# Patient Record
Sex: Female | Born: 1960
Health system: Southern US, Community
[De-identification: ages and names within clinical notes are randomized; demographics above are authoritative.]

## PROBLEM LIST (undated history)

## (undated) DIAGNOSIS — Z789 Other specified health status: Secondary | ICD-10-CM

## (undated) HISTORY — PX: REFRACTIVE SURGERY: SHX103

## (undated) HISTORY — PX: WISDOM TOOTH EXTRACTION: SHX21

## (undated) HISTORY — PX: LASIK: SHX215

## (undated) HISTORY — PX: COSMETIC SURGERY: SHX468

## (undated) HISTORY — PX: CYST EXCISION: SHX5701

---

## 2012-10-19 ENCOUNTER — Other Ambulatory Visit: Payer: Self-pay | Admitting: Orthopedic Surgery

## 2012-10-20 NOTE — H&P (Signed)
Zelene Barga is an 52 y.o. female.   CC / Reason for Visit: Right thumb pain HPI: This patient returns for reevaluation.  She reports that the injection caused decreased pain and locking, but she still reports considerable pain with hyperextension forces.  She does not think that her avid cycling bothers this.   Presenting history follows: This patient is a 52 year old female who presents for evaluation of her right thumb.  She reports that it will lock in flexion and that there is a lot of pain if it is forced into extension or hyperextension.  She has switched to using her left hand for her computer mouse.  No past medical history on file.  No past surgical history on file.  No family history on file. Social History:  has no tobacco, alcohol, and drug history on file.  Allergies: Allergies not on file  No prescriptions prior to admission    No results found for this or any previous visit (from the past 48 hour(s)). No results found.  Review of Systems  All other systems reviewed and are negative.    There were no vitals taken for this visit. Physical Exam  Constitutional:  WD, WN, NAD HEENT:  NCAT, EOMI Neuro/Psych:  Alert & oriented to person, place, and time; appropriate mood & affect Lymphatic: No generalized UE edema or lymphadenopathy Extremities / MSK:  Both UE are normal with respect to appearance, ranges of motion, joint stability, muscle strength/tone, sensation, & perfusion except as otherwise noted:  Right thumb tender over the A1 pulley, locking both with flexion and extension.  Labs / Xrays:  No radiographic studies obtained today.  Assessment: Right trigger thumb  Plan:  I discussed these findings with her.  I discussed the recommendation for surgical release if this injection fails to cause it to resolve.   The details of the operative procedure were discussed with the patient.  Questions were invited and answered.  In addition to the goal of the  procedure, the risks of the procedure to include but not limited to bleeding; infection; damage to the nerves or blood vessels that could result in bleeding, numbness, weakness, chronic pain, and the need for additional procedures; stiffness; the need for revision surgery; and anesthetic risks, the worst of which is death, were reviewed.  No specific outcome was guaranteed or implied.  Informed consent was obtained.     Adysen Raphael A. 10/20/2012, 3:30 PM

## 2012-10-21 ENCOUNTER — Encounter (HOSPITAL_BASED_OUTPATIENT_CLINIC_OR_DEPARTMENT_OTHER): Payer: Self-pay | Admitting: *Deleted

## 2012-10-21 NOTE — Progress Notes (Signed)
Just moved from [place]-cycles-no health issues

## 2012-10-24 ENCOUNTER — Ambulatory Visit (HOSPITAL_BASED_OUTPATIENT_CLINIC_OR_DEPARTMENT_OTHER)
Admission: RE | Admit: 2012-10-24 | Discharge: 2012-10-24 | Disposition: A | Discharge status: Home / Self Care | Payer: Federal, State, Local not specified - PPO | Source: Ambulatory Visit | Attending: Orthopedic Surgery | Admitting: Orthopedic Surgery

## 2012-10-24 ENCOUNTER — Encounter (HOSPITAL_BASED_OUTPATIENT_CLINIC_OR_DEPARTMENT_OTHER): Payer: Self-pay | Admitting: *Deleted

## 2012-10-24 ENCOUNTER — Encounter (HOSPITAL_BASED_OUTPATIENT_CLINIC_OR_DEPARTMENT_OTHER)
Admission: RE | Disposition: A | Discharge status: Home / Self Care | Payer: Self-pay | Source: Ambulatory Visit | Attending: Orthopedic Surgery

## 2012-10-24 ENCOUNTER — Encounter (HOSPITAL_BASED_OUTPATIENT_CLINIC_OR_DEPARTMENT_OTHER): Payer: Self-pay | Admitting: Anesthesiology

## 2012-10-24 ENCOUNTER — Ambulatory Visit (HOSPITAL_BASED_OUTPATIENT_CLINIC_OR_DEPARTMENT_OTHER): Payer: Federal, State, Local not specified - PPO | Admitting: Anesthesiology

## 2012-10-24 DIAGNOSIS — M6530 Trigger finger, unspecified finger: Secondary | ICD-10-CM | POA: Insufficient documentation

## 2012-10-24 HISTORY — DX: Other specified health status: Z78.9

## 2012-10-24 HISTORY — PX: TRIGGER FINGER RELEASE: SHX641

## 2012-10-24 SURGERY — RELEASE, A1 PULLEY, FOR TRIGGER FINGER
Anesthesia: Monitor Anesthesia Care | Site: Thumb | Laterality: Right | Wound class: Clean

## 2012-10-24 MED ORDER — LACTATED RINGERS IV SOLN
INTRAVENOUS | Status: DC
  Administered 2012-10-24: 10:00:00 via INTRAVENOUS

## 2012-10-24 MED ORDER — OXYCODONE HCL 5 MG/5ML PO SOLN: 5.0000 mg | Freq: Once | ORAL | Status: DC | PRN

## 2012-10-24 MED ORDER — OXYCODONE HCL 5 MG PO TABS: 5.0000 mg | ORAL_TABLET | Freq: Once | ORAL | Status: DC | PRN

## 2012-10-24 MED ORDER — MIDAZOLAM HCL 5 MG/5ML IJ SOLN
INTRAMUSCULAR | Status: DC | PRN
  Administered 2012-10-24: 2 mg via INTRAVENOUS

## 2012-10-24 MED ORDER — HYDROMORPHONE HCL PF 1 MG/ML IJ SOLN: 0.2500 mg | INTRAMUSCULAR | Status: DC | PRN

## 2012-10-24 MED ORDER — PROPOFOL INFUSION 10 MG/ML OPTIME
INTRAVENOUS | Status: DC | PRN
  Administered 2012-10-24: 100 ug/kg/min via INTRAVENOUS

## 2012-10-24 MED ORDER — CEFAZOLIN SODIUM-DEXTROSE 2-3 GM-% IV SOLR
2.0000 g | INTRAVENOUS | Status: AC
  Administered 2012-10-24: 2 g via INTRAVENOUS

## 2012-10-24 MED ORDER — ONDANSETRON HCL 4 MG/2ML IJ SOLN: 4.0000 mg | Freq: Once | INTRAMUSCULAR | Status: DC | PRN

## 2012-10-24 MED ORDER — FENTANYL CITRATE 0.05 MG/ML IJ SOLN
INTRAMUSCULAR | Status: DC | PRN
  Administered 2012-10-24: 50 ug via INTRAVENOUS

## 2012-10-24 MED ORDER — LIDOCAINE-EPINEPHRINE (PF) 1 %-1:200000 IJ SOLN
INTRAMUSCULAR | Status: DC | PRN
  Administered 2012-10-24: 1.5 mL

## 2012-10-24 MED ORDER — ONDANSETRON HCL 4 MG/2ML IJ SOLN
INTRAMUSCULAR | Status: DC | PRN
  Administered 2012-10-24: 4 mg via INTRAVENOUS

## 2012-10-24 MED ORDER — HYDROCODONE-ACETAMINOPHEN 5-325 MG PO TABS: 1.0000 | ORAL_TABLET | ORAL | Status: DC | PRN

## 2012-10-24 MED ORDER — BUPIVACAINE HCL 0.25 % IJ SOLN
INTRAMUSCULAR | Status: DC | PRN
  Administered 2012-10-24: 1.5 mL

## 2012-10-24 MED ORDER — LACTATED RINGERS IV SOLN
INTRAVENOUS | Status: DC
  Administered 2012-10-24: 09:00:00 via INTRAVENOUS

## 2012-10-24 SURGICAL SUPPLY — 38 items
BANDAGE COBAN STERILE 2 (GAUZE/BANDAGES/DRESSINGS) IMPLANT
BANDAGE CONFORM 2  STR LF (GAUZE/BANDAGES/DRESSINGS) ×2 IMPLANT
BANDAGE GAUZE ELAST BULKY 4 IN (GAUZE/BANDAGES/DRESSINGS) IMPLANT
BLADE MINI RND TIP GREEN BEAV (BLADE) IMPLANT
BLADE SURG 15 STRL LF DISP TIS (BLADE) ×1 IMPLANT
BLADE SURG 15 STRL SS (BLADE) ×1
BNDG COHESIVE 1X5 TAN STRL LF (GAUZE/BANDAGES/DRESSINGS) ×2 IMPLANT
BNDG COHESIVE 4X5 TAN STRL (GAUZE/BANDAGES/DRESSINGS) IMPLANT
BNDG ESMARK 4X9 LF (GAUZE/BANDAGES/DRESSINGS) ×2 IMPLANT
CHLORAPREP W/TINT 26ML (MISCELLANEOUS) ×2 IMPLANT
COVER MAYO STAND STRL (DRAPES) ×2 IMPLANT
COVER TABLE BACK 60X90 (DRAPES) ×2 IMPLANT
CUFF TOURNIQUET SINGLE 18IN (TOURNIQUET CUFF) ×2 IMPLANT
DRAPE EXTREMITY T 121X128X90 (DRAPE) ×2 IMPLANT
DRAPE SURG 17X23 STRL (DRAPES) ×2 IMPLANT
DRSG EMULSION OIL 3X3 NADH (GAUZE/BANDAGES/DRESSINGS) ×2 IMPLANT
GAUZE SPONGE 4X4 12PLY STRL LF (GAUZE/BANDAGES/DRESSINGS) ×2 IMPLANT
GLOVE BIO SURGEON STRL SZ 6.5 (GLOVE) ×2 IMPLANT
GLOVE BIO SURGEON STRL SZ7.5 (GLOVE) ×2 IMPLANT
GLOVE BIOGEL PI IND STRL 7.0 (GLOVE) ×2 IMPLANT
GLOVE BIOGEL PI IND STRL 8 (GLOVE) ×1 IMPLANT
GLOVE BIOGEL PI INDICATOR 7.0 (GLOVE) ×2
GLOVE BIOGEL PI INDICATOR 8 (GLOVE) ×1
GOWN PREVENTION PLUS XLARGE (GOWN DISPOSABLE) ×4 IMPLANT
NDL SAFETY ECLIPSE 18X1.5 (NEEDLE) ×1 IMPLANT
NEEDLE HYPO 18GX1.5 SHARP (NEEDLE) ×1
NEEDLE HYPO 25X1 1.5 SAFETY (NEEDLE) ×2 IMPLANT
NS IRRIG 1000ML POUR BTL (IV SOLUTION) ×2 IMPLANT
PACK BASIN DAY SURGERY FS (CUSTOM PROCEDURE TRAY) ×2 IMPLANT
PADDING CAST ABS 4INX4YD NS (CAST SUPPLIES)
PADDING CAST ABS COTTON 4X4 ST (CAST SUPPLIES) IMPLANT
STOCKINETTE 4X48 STRL (DRAPES) ×2 IMPLANT
SUT VICRYL RAPIDE 4/0 PS 2 (SUTURE) ×2 IMPLANT
SYR BULB 3OZ (MISCELLANEOUS) ×2 IMPLANT
SYRINGE 10CC LL (SYRINGE) ×2 IMPLANT
TOWEL OR 17X24 6PK STRL BLUE (TOWEL DISPOSABLE) ×2 IMPLANT
TOWEL OR NON WOVEN STRL DISP B (DISPOSABLE) ×2 IMPLANT
UNDERPAD 30X30 INCONTINENT (UNDERPADS AND DIAPERS) ×2 IMPLANT

## 2012-10-24 NOTE — Transfer of Care (Signed)
Immediate Anesthesia Transfer of Care Note  Patient: Tina Garrett  Procedure(s) Performed: Procedure(s): RELEASE RIGHT TRIGGER THUMB (Right)  Patient Location: PACU  Anesthesia Type:MAC  Level of Consciousness: awake, alert  and oriented  Airway & Oxygen Therapy: Patient Spontanous Breathing  Post-op Assessment: Report given to PACU RN and Post -op Vital signs reviewed and stable  Post vital signs: Reviewed and stable  Complications: No apparent anesthesia complications

## 2012-10-24 NOTE — Anesthesia Preprocedure Evaluation (Signed)
Anesthesia Evaluation  Patient identified by MRN, date of birth, ID band Patient awake    Reviewed: Allergy & Precautions, H&P , NPO status , Patient's Chart, lab work & pertinent test results  Airway Mallampati: I TM Distance: >3 FB Neck ROM: Full    Dental  (+) Teeth Intact and Dental Advisory Given   Pulmonary  breath sounds clear to auscultation        Cardiovascular Rhythm:Regular Rate:Normal     Neuro/Psych    GI/Hepatic   Endo/Other    Renal/GU      Musculoskeletal   Abdominal   Peds  Hematology   Anesthesia Other Findings   Reproductive/Obstetrics                           Anesthesia Physical Anesthesia Plan  ASA: I  Anesthesia Plan: MAC   Post-op Pain Management:    Induction: Intravenous  Airway Management Planned: Simple Face Mask and Mask  Additional Equipment:   Intra-op Plan:   Post-operative Plan:   Informed Consent: I have reviewed the patients History and Physical, chart, labs and discussed the procedure including the risks, benefits and alternatives for the proposed anesthesia with the patient or authorized representative who has indicated his/her understanding and acceptance.   Dental advisory given  Plan Discussed with: CRNA, Anesthesiologist and Surgeon  Anesthesia Plan Comments:         Anesthesia Quick Evaluation

## 2012-10-24 NOTE — Anesthesia Postprocedure Evaluation (Signed)
  Anesthesia Post-op Note  Patient: Tina Garrett  Procedure(s) Performed: Procedure(s): RELEASE RIGHT TRIGGER THUMB (Right)  Patient Location: PACU  Anesthesia Type:MAC  Level of Consciousness: awake, alert  and oriented  Airway and Oxygen Therapy: Patient Spontanous Breathing  Post-op Pain: mild  Post-op Assessment: Post-op Vital signs reviewed  Post-op Vital Signs: Reviewed  Complications: No apparent anesthesia complications

## 2012-10-24 NOTE — Op Note (Signed)
10/24/2012  10:01 AM  PATIENT:  Tina Garrett  52 y.o. female  PRE-OPERATIVE DIAGNOSIS:  Right trigger thumb  POST-OPERATIVE DIAGNOSIS:  Same  PROCEDURE:  Right trigger thumb release  SURGEON: Cliffton Asters. Janee Morn, MD  PHYSICIAN ASSISTANT: None  ANESTHESIA:  local and MAC  SPECIMENS:  None  DRAINS:   None  PREOPERATIVE INDICATIONS:  Tina Garrett is a  52 y.o. female with a diagnosis of right trigger thumb who failed conservative measures and elected for surgical management.    The risks benefits and alternatives were discussed with the patient preoperatively including but not limited to the risks of infection, bleeding, nerve injury, cardiopulmonary complications, the need for revision surgery, among others, and the patient verbalized understanding and consented to proceed.  OPERATIVE IMPLANTS: None  OPERATIVE FINDINGS: FPL had longitudinal split tear. This was debrided and internalized with 4-0 Vicryl Rapide suture  OPERATIVE PROCEDURE:  After receiving prophylactic antibiotics, the patient was escorted to the operative theatre and placed in a supine position.  A surgical "time-out" was performed during which the planned procedure, proposed operative site, and the correct patient identity were compared to the operative consent and agreement confirmed by the circulating nurse according to current facility policy. Local anesthetic was instilled, consisting of a mixture of lidocaine and Marcaine bearing epinephrine. Following application of a tourniquet to the operative extremity, the exposed skin was prepped with Chloraprep and draped in the usual sterile fashion.  The limb was exsanguinated with an Esmarch bandage and the tourniquet inflated to approximately higher than systolic BP.  A transverse incision was made at the base of the right thumb exploiting the skin crease. Skin and subcutaneous tissues were dissected with spreading dissection and the radial and ulnar  digital nerves were retracted and protected. The annular pulley was split with care to preserve the oblique pulley. There were no additional more proximal crossing bands across the tendon. The tendon was pulled into view and found to have a longitudinal split tear which was debrided and then internalized using 4-0 Vicryl Rapide sutures. Everything was irrigated, tourniquet was released, and the skin was closed with 4-0 Vicryl Rapide interrupted horizontal mattress sutures. A soft dressing was applied.  DISPOSITION: She will be discharged home today with typical postop instructions, returning in 10-15 days for reevaluation.

## 2012-10-24 NOTE — Interval H&P Note (Signed)
History and Physical Interval Note:  10/24/2012 10:01 AM  Tina Garrett  has presented today for surgery, with the diagnosis of RIGHT TRIGGER THUMB  The various methods of treatment have been discussed with the patient and family. After consideration of risks, benefits and other options for treatment, the patient has consented to  Procedure(s): RELEASE RIGHT TRIGGER THUMB (Right) as a surgical intervention .  The patient's history has been reviewed, patient examined, no change in status, stable for surgery.  I have reviewed the patient's chart and labs.  Questions were answered to the patient's satisfaction.     Chaundra Abreu A.

## 2012-10-25 ENCOUNTER — Encounter (HOSPITAL_BASED_OUTPATIENT_CLINIC_OR_DEPARTMENT_OTHER): Payer: Self-pay | Admitting: Orthopedic Surgery

## 2017-07-07 DIAGNOSIS — K080 Exfoliation of teeth due to systemic causes: Secondary | ICD-10-CM | POA: Diagnosis not present

## 2017-09-16 DIAGNOSIS — K080 Exfoliation of teeth due to systemic causes: Secondary | ICD-10-CM | POA: Diagnosis not present

## 2018-06-08 ENCOUNTER — Other Ambulatory Visit: Payer: Self-pay

## 2018-06-08 ENCOUNTER — Encounter: Payer: Self-pay | Admitting: Obstetrics and Gynecology

## 2018-06-08 ENCOUNTER — Ambulatory Visit (INDEPENDENT_AMBULATORY_CARE_PROVIDER_SITE_OTHER): Payer: Federal, State, Local not specified - PPO | Admitting: Obstetrics and Gynecology

## 2018-06-08 VITALS — BP 118/72 | HR 72 | Ht 65.0 in | Wt 139.8 lb

## 2018-06-08 DIAGNOSIS — Z124 Encounter for screening for malignant neoplasm of cervix: Secondary | ICD-10-CM | POA: Diagnosis not present

## 2018-06-08 DIAGNOSIS — Z1211 Encounter for screening for malignant neoplasm of colon: Secondary | ICD-10-CM | POA: Diagnosis not present

## 2018-06-08 DIAGNOSIS — Z0000 Encounter for general adult medical examination without abnormal findings: Secondary | ICD-10-CM | POA: Diagnosis not present

## 2018-06-08 DIAGNOSIS — Z01419 Encounter for gynecological examination (general) (routine) without abnormal findings: Secondary | ICD-10-CM

## 2018-06-08 DIAGNOSIS — Z789 Other specified health status: Secondary | ICD-10-CM | POA: Diagnosis not present

## 2018-06-08 DIAGNOSIS — N644 Mastodynia: Secondary | ICD-10-CM

## 2018-06-08 NOTE — Patient Instructions (Signed)
To try and decrease your breast pain, you should have a well fitting supportive bra, cut back on caffeine, and use ice or heat as needed. Some women find relief with the supplement evening primrose oil.  EXERCISE AND DIET:  We recommended that you start or continue a regular exercise program for good health. Regular exercise means any activity that makes your heart beat faster and makes you sweat.  We recommend exercising at least 30 minutes per day at least 3 days a week, preferably 4 or 5.  We also recommend a diet low in fat and sugar.  Inactivity, poor dietary choices and obesity can cause diabetes, heart attack, stroke, and kidney damage, among others.    ALCOHOL AND SMOKING:  Women should limit their alcohol intake to no more than 7 drinks/beers/glasses of wine (combined, not each!) per week. Moderation of alcohol intake to this level decreases your risk of breast cancer and liver damage. And of course, no recreational drugs are part of a healthy lifestyle.  And absolutely no smoking or even second hand smoke. Most people know smoking can cause heart and lung diseases, but did you know it also contributes to weakening of your bones? Aging of your skin?  Yellowing of your teeth and nails?  CALCIUM AND VITAMIN D:  Adequate intake of calcium and Vitamin D are recommended.  The recommendations for exact amounts of these supplements seem to change often, but generally speaking 1,200 mg of calcium (between diet and supplement) and 800 units of Vitamin D per day seems prudent. Certain women may benefit from higher intake of Vitamin D.  If you are among these women, your doctor will have told you during your visit.    PAP SMEARS:  Pap smears, to check for cervical cancer or precancers,  have traditionally been done yearly, although recent scientific advances have shown that most women can have pap smears less often.  However, every woman still should have a physical exam from her gynecologist every year. It  will include a breast check, inspection of the vulva and vagina to check for abnormal growths or skin changes, a visual exam of the cervix, and then an exam to evaluate the size and shape of the uterus and ovaries.  And after 58 years of age, a rectal exam is indicated to check for rectal cancers. We will also provide age appropriate advice regarding health maintenance, like when you should have certain vaccines, screening for sexually transmitted diseases, bone density testing, colonoscopy, mammograms, etc.   MAMMOGRAMS:  All women over 58 years old should have a yearly mammogram. Many facilities now offer a "3D" mammogram, which may cost around $50 extra out of pocket. If possible,  we recommend you accept the option to have the 3D mammogram performed.  It both reduces the number of women who will be called back for extra views which then turn out to be normal, and it is better than the routine mammogram at detecting truly abnormal areas.    COLON CANCER SCREENING: Now recommend starting at age 58. At this time colonoscopy is not covered for routine screening until 50. There are take home tests that can be done between 45-49.   COLONOSCOPY:  Colonoscopy to screen for colon cancer is recommended for all women at age 58.  We know, you hate the idea of the prep.  We agree, BUT, having colon cancer and not knowing it is worse!!  Colon cancer so often starts as a polyp that can be seen  and removed at colonscopy, which can quite literally save your life!  And if your first colonoscopy is normal and you have no family history of colon cancer, most women don't have to have it again for 10 years.  Once every ten years, you can do something that may end up saving your life, right?  We will be happy to help you get it scheduled when you are ready.  Be sure to check your insurance coverage so you understand how much it will cost.  It may be covered as a preventative service at no cost, but you should check your  particular policy.      Breast Self-Awareness Breast self-awareness means being familiar with how your breasts look and feel. It involves checking your breasts regularly and reporting any changes to your health care provider. Practicing breast self-awareness is important. A change in your breasts can be a sign of a serious medical problem. Being familiar with how your breasts look and feel allows you to find any problems early, when treatment is more likely to be successful. All women should practice breast self-awareness, including women who have had breast implants. How to do a breast self-exam One way to learn what is normal for your breasts and whether your breasts are changing is to do a breast self-exam. To do a breast self-exam: Look for Changes  1. Remove all the clothing above your waist. 2. Stand in front of a mirror in a room with good lighting. 3. Put your hands on your hips. 4. Push your hands firmly downward. 5. Compare your breasts in the mirror. Look for differences between them (asymmetry), such as: ? Differences in shape. ? Differences in size. ? Puckers, dips, and bumps in one breast and not the other. 6. Look at each breast for changes in your skin, such as: ? Redness. ? Scaly areas. 7. Look for changes in your nipples, such as: ? Discharge. ? Bleeding. ? Dimpling. ? Redness. ? A change in position. Feel for Changes Carefully feel your breasts for lumps and changes. It is best to do this while lying on your back on the floor and again while sitting or standing in the shower or tub with soapy water on your skin. Feel each breast in the following way:  Place the arm on the side of the breast you are examining above your head.  Feel your breast with the other hand.  Start in the nipple area and make  inch (2 cm) overlapping circles to feel your breast. Use the pads of your three middle fingers to do this. Apply light pressure, then medium pressure, then firm  pressure. The light pressure will allow you to feel the tissue closest to the skin. The medium pressure will allow you to feel the tissue that is a little deeper. The firm pressure will allow you to feel the tissue close to the ribs.  Continue the overlapping circles, moving downward over the breast until you feel your ribs below your breast.  Move one finger-width toward the center of the body. Continue to use the  inch (2 cm) overlapping circles to feel your breast as you move slowly up toward your collarbone.  Continue the up and down exam using all three pressures until you reach your armpit.  Write Down What You Find  Write down what is normal for each breast and any changes that you find. Keep a written record with breast changes or normal findings for each breast. By writing this  information down, you do not need to depend only on memory for size, tenderness, or location. Write down where you are in your menstrual cycle, if you are still menstruating. If you are having trouble noticing differences in your breasts, do not get discouraged. With time you will become more familiar with the variations in your breasts and more comfortable with the exam. How often should I examine my breasts? Examine your breasts every month. If you are breastfeeding, the best time to examine your breasts is after a feeding or after using a breast pump. If you menstruate, the best time to examine your breasts is 5-7 days after your period is over. During your period, your breasts are lumpier, and it may be more difficult to notice changes. When should I see my health care provider? See your health care provider if you notice:  A change in shape or size of your breasts or nipples.  A change in the skin of your breast or nipples, such as a reddened or scaly area.  Unusual discharge from your nipples.  A lump or thick area that was not there before.  Pain in your breasts.  Anything that concerns you.

## 2018-06-08 NOTE — Progress Notes (Signed)
58 y.o. G0P0000 Single White or Caucasian Not Hispanic or Latino female here for annual exam.   She c/o pain in her left breast in the last week. Almost feels like a muscle pull, goes across her breast into her axilla. She has lost 35 lbs in under a year. She has gone down in bra size, but not sure her bra is fitting well on the left. She thinks she may have pulled a muscle. She feels more discomfort in her nipple when she breaths in, feels superficial and deep. The pain is mild, not really tender.  She is active, no specific exercise.  She is sexually active, engaged, same partner x 20 years. No pain. No dyspareunia. No vasomotor symptoms.     Patient's last menstrual period was 09/22/2012.          Sexually active: Yes.    The current method of family planning is post menopausal status.    Exercising: Yes.    walking Smoker:  no  Health Maintenance: Pap:  2014 WNL History of abnormal Pap:  no MMG:  2012 WNL per patient BMD:   Never Colonoscopy: Never TDaP:  Unsure Gardasil: N/A   reports that she has never smoked. She has never used smokeless tobacco. She reports current alcohol use. She reports that she does not use drugs. ETOH rare. She has MBA (finished last year). 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   Past Medical History:  Diagnosis Date  . Medical history non-contributory     Past Surgical History:  Procedure Laterality Date  . CYST EXCISION     lt upper arm  . LASIK    . REFRACTIVE SURGERY    . TRIGGER FINGER RELEASE Right 10/24/2012   Procedure: RELEASE RIGHT TRIGGER THUMB;  Surgeon: Jodi Marble, MD;  Location: [facility];  Service: Orthopedics;  Laterality: Right;  . WISDOM TOOTH EXTRACTION      Current Outpatient Medications  Medication Sig Dispense Refill  . Cyanocobalamin (VITAMIN B 12 PO) Take by mouth. Taking once a week    . Multiple Vitamin (MULTIVITAMIN) tablet Take 1 tablet by mouth daily.     No current  facility-administered medications for this visit.     Family History  Problem Relation Age of Onset  . Thyroid disease Mother   . Breast cancer Maternal Grandmother   MGM with breast cancer at 35  Review of Systems  Constitutional:       Tenderness left breast  HENT: Negative.   Eyes: Negative.   Respiratory: Negative.   Cardiovascular: Negative.   Gastrointestinal: Negative.   Endocrine: Negative.   Genitourinary: Negative.   Musculoskeletal: Negative.   Skin: Negative.   Allergic/Immunologic: Negative.   Neurological: Negative.   Hematological: Negative.   Psychiatric/Behavioral: Negative.     Exam:   BP 118/72 (BP Location: Right Arm, Patient Position: Sitting, Cuff Size: Normal)   Pulse 72   Ht 5\' 5"  (1.651 m)   Wt 139 lb 12.8 oz (63.4 kg)   LMP 09/22/2012   BMI 23.26 kg/m   Weight change: @WEIGHTCHANGE @ Height:   Height: 5\' 5"  (165.1 cm)  Ht Readings from Last 3 Encounters:  06/08/18 5\' 5"  (1.651 m)  10/24/12 5\' 5"  (1.651 m)    General appearance: alert, cooperative and appears stated age Head: Normocephalic, without obvious abnormality, atraumatic Neck: no adenopathy, supple, symmetrical, trachea midline and thyroid normal to inspection and palpation Lungs: clear to auscultation bilaterally Cardiovascular: regular rate  and rhythm Breasts: normal appearance, no masses or tenderness Abdomen: soft, non-tender; non distended,  no masses,  no organomegaly Extremities: extremities normal, atraumatic, no cyanosis or edema Skin: Skin color, texture, turgor normal. No rashes or lesions Lymph nodes: Cervical, supraclavicular, and axillary nodes normal. No abnormal inguinal nodes palpated Neurologic: Grossly normal   Pelvic: External genitalia:  no lesions              Urethra:  normal appearing urethra with no masses, tenderness or lesions              Bartholins and Skenes: normal                 Vagina: atrophic appearing vagina with normal color and discharge,  no lesions              Cervix: no lesions               Bimanual Exam:  Uterus:  normal size, contour, position, consistency, mobility, non-tender              Adnexa: no mass, fullness, tenderness               Rectovaginal: Confirms               Anus:  normal sphincter tone, no lesions  Chaperone was present for exam.  A:  Well Woman with normal exam  Left breast pain, may be MS  Vegan  P:   Pap with hpv  Fasting labs, vit d and vit B12  Mammogram due, #'s given. If her breast pain isn't better in a week she will call and we will set her up for a diagnostic mammogram. If her pain improves she will schedule a screening mammogram  Colonoscopy referral placed  Discussed breast self exam  Discussed calcium and vit D intake

## 2018-06-09 ENCOUNTER — Other Ambulatory Visit (HOSPITAL_COMMUNITY)
Admission: RE | Admit: 2018-06-09 | Discharge: 2018-06-09 | Disposition: A | Discharge status: Home / Self Care | Payer: Federal, State, Local not specified - PPO | Source: Ambulatory Visit | Attending: Obstetrics and Gynecology | Admitting: Obstetrics and Gynecology

## 2018-06-09 DIAGNOSIS — Z124 Encounter for screening for malignant neoplasm of cervix: Secondary | ICD-10-CM | POA: Insufficient documentation

## 2018-06-09 LAB — VITAMIN D 25 HYDROXY (VIT D DEFICIENCY, FRACTURES): VIT D 25 HYDROXY: 16.7 ng/mL — AB (ref 30.0–100.0)

## 2018-06-09 LAB — COMPREHENSIVE METABOLIC PANEL
ALK PHOS: 72 IU/L (ref 39–117)
ALT: 17 IU/L (ref 0–32)
AST: 19 IU/L (ref 0–40)
Albumin/Globulin Ratio: 2 (ref 1.2–2.2)
Albumin: 4.5 g/dL (ref 3.8–4.9)
BILIRUBIN TOTAL: 0.5 mg/dL (ref 0.0–1.2)
BUN / CREAT RATIO: 9 (ref 9–23)
BUN: 5 mg/dL — ABNORMAL LOW (ref 6–24)
CALCIUM: 9.3 mg/dL (ref 8.7–10.2)
CHLORIDE: 101 mmol/L (ref 96–106)
CO2: 25 mmol/L (ref 20–29)
CREATININE: 0.57 mg/dL (ref 0.57–1.00)
GFR calc non Af Amer: 103 mL/min/{1.73_m2} (ref >59)
GFR, EST AFRICAN AMERICAN: 118 mL/min/{1.73_m2} (ref >59)
GLOBULIN, TOTAL: 2.2 g/dL (ref 1.5–4.5)
Glucose: 82 mg/dL (ref 65–99)
Potassium: 3.9 mmol/L (ref 3.5–5.2)
SODIUM: 141 mmol/L (ref 134–144)
Total Protein: 6.7 g/dL (ref 6.0–8.5)

## 2018-06-09 LAB — LIPID PANEL
CHOLESTEROL TOTAL: 156 mg/dL (ref 100–199)
Chol/HDL Ratio: 3.5 ratio (ref 0.0–4.4)
HDL: 45 mg/dL (ref >39)
LDL CALC: 87 mg/dL (ref 0–99)
Triglycerides: 119 mg/dL (ref 0–149)
VLDL Cholesterol Cal: 24 mg/dL (ref 5–40)

## 2018-06-09 LAB — CBC
HEMATOCRIT: 40.3 % (ref 34.0–46.6)
Hemoglobin: 13.4 g/dL (ref 11.1–15.9)
MCH: 30.8 pg (ref 26.6–33.0)
MCHC: 33.3 g/dL (ref 31.5–35.7)
MCV: 93 fL (ref 79–97)
PLATELETS: 247 10*3/uL (ref 150–450)
RBC: 4.35 x10E6/uL (ref 3.77–5.28)
RDW: 12.2 % (ref 11.7–15.4)
WBC: 5.6 10*3/uL (ref 3.4–10.8)

## 2018-06-09 LAB — VITAMIN B12: Vitamin B-12: 1087 pg/mL (ref 232–1245)

## 2018-06-09 NOTE — Addendum Note (Signed)
Addended by: Tobi Bastos on: 06/09/2018 04:50 PM   Modules accepted: Orders

## 2018-06-14 LAB — CYTOLOGY - PAP
Diagnosis: NEGATIVE
HPV (WINDOPATH): NOT DETECTED

## 2018-06-16 ENCOUNTER — Other Ambulatory Visit: Payer: Self-pay | Admitting: Obstetrics and Gynecology

## 2018-06-16 DIAGNOSIS — Z1231 Encounter for screening mammogram for malignant neoplasm of breast: Secondary | ICD-10-CM

## 2018-07-20 ENCOUNTER — Ambulatory Visit
Admission: RE | Admit: 2018-07-20 | Discharge: 2018-07-20 | Disposition: A | Discharge status: Home / Self Care | Payer: Federal, State, Local not specified - PPO | Source: Ambulatory Visit | Attending: Obstetrics and Gynecology | Admitting: Obstetrics and Gynecology

## 2018-07-20 DIAGNOSIS — Z1231 Encounter for screening mammogram for malignant neoplasm of breast: Secondary | ICD-10-CM | POA: Diagnosis not present

## 2018-08-08 ENCOUNTER — Other Ambulatory Visit: Payer: Self-pay | Admitting: Obstetrics and Gynecology

## 2018-08-08 DIAGNOSIS — R928 Other abnormal and inconclusive findings on diagnostic imaging of breast: Secondary | ICD-10-CM

## 2018-09-15 ENCOUNTER — Ambulatory Visit
Admission: RE | Admit: 2018-09-15 | Discharge: 2018-09-15 | Disposition: A | Discharge status: Home / Self Care | Payer: Federal, State, Local not specified - PPO | Source: Ambulatory Visit | Attending: Obstetrics and Gynecology | Admitting: Obstetrics and Gynecology

## 2018-09-15 ENCOUNTER — Other Ambulatory Visit: Payer: Self-pay

## 2018-09-15 ENCOUNTER — Ambulatory Visit: Payer: Federal, State, Local not specified - PPO

## 2018-09-15 DIAGNOSIS — R928 Other abnormal and inconclusive findings on diagnostic imaging of breast: Secondary | ICD-10-CM

## 2018-11-14 ENCOUNTER — Telehealth: Payer: Self-pay | Admitting: Obstetrics and Gynecology

## 2018-11-14 NOTE — Telephone Encounter (Signed)
Reminder to schedule colonoscopy sent in [place].

## 2019-06-12 NOTE — Progress Notes (Deleted)
59 y.o. G0P0000 Single White or Caucasian Not Hispanic or Latino female here for annual exam.      Patient's last menstrual period was 09/22/2012.          Sexually active: {yes no:314532}  The current method of family planning is {contraception:315051}.    Exercising: {yes no:314532}  {types:19826} Smoker:  {YES NO:22349}  Health Maintenance: Pap:06/09/18 Neg HR HPV Neg  2014 WNL History of abnormal Pap:  {YES NO:22349} MMG:  09/15/18 Diagnostic Density B Bi-rads 1 neg  BMD:   never Colonoscopy: *** TDaP:  *** Gardasil: ***   reports that she has never smoked. She has never used smokeless tobacco. She reports current alcohol use. She reports that she does not use drugs.  Past Medical History:  Diagnosis Date  . Medical history non-contributory     Past Surgical History:  Procedure Laterality Date  . CYST EXCISION     lt upper arm  . LASIK    . REFRACTIVE SURGERY    . TRIGGER FINGER RELEASE Right 10/24/2012   Procedure: RELEASE RIGHT TRIGGER THUMB;  Surgeon: Jodi Marble, MD;  Location: [facility];  Service: Orthopedics;  Laterality: Right;  . WISDOM TOOTH EXTRACTION      Current Outpatient Medications  Medication Sig Dispense Refill  . Cyanocobalamin (VITAMIN B 12 PO) Take by mouth. Taking once a week    . Multiple Vitamin (MULTIVITAMIN) tablet Take 1 tablet by mouth daily.     No current facility-administered medications for this visit.    Family History  Problem Relation Age of Onset  . Thyroid disease Mother   . Breast cancer Maternal Grandmother     Review of Systems  Exam:   LMP 09/22/2012   Weight change: @WEIGHTCHANGE @ Height:      Ht Readings from Last 3 Encounters:  06/08/18 5\' 5"  (1.651 m)  10/24/12 5\' 5"  (1.651 m)    General appearance: alert, cooperative and appears stated age Head: Normocephalic, without obvious abnormality, atraumatic Neck: no adenopathy, supple, symmetrical, trachea midline and thyroid {CHL AMB PHY EX THYROID  NORM DEFAULT:[phone]::"normal to inspection and palpation"} Lungs: clear to auscultation bilaterally Cardiovascular: regular rate and rhythm Breasts: {Exam; breast:13139::"normal appearance, no masses or tenderness"} Abdomen: soft, non-tender; non distended,  no masses,  no organomegaly Extremities: extremities normal, atraumatic, no cyanosis or edema Skin: Skin color, texture, turgor normal. No rashes or lesions Lymph nodes: Cervical, supraclavicular, and axillary nodes normal. No abnormal inguinal nodes palpated Neurologic: Grossly normal   Pelvic: External genitalia:  no lesions              Urethra:  normal appearing urethra with no masses, tenderness or lesions              Bartholins and Skenes: normal                 Vagina: normal appearing vagina with normal color and discharge, no lesions              Cervix: {CHL AMB PHY EX CERVIX NORM DEFAULT:[phone]::"no lesions"}               Bimanual Exam:  Uterus:  {CHL AMB PHY EX UTERUS NORM DEFAULT:[phone]::"normal size, contour, position, consistency, mobility, non-tender"}              Adnexa: {CHL AMB PHY EX ADNEXA NO MASS DEFAULT:[phone]::"no mass, fullness, tenderness"}               Rectovaginal: Confirms  Anus:  normal sphincter tone, no lesions  *** chaperoned for the exam.  A:  Well Woman with normal exam  P:

## 2019-06-14 ENCOUNTER — Ambulatory Visit: Payer: Federal, State, Local not specified - PPO | Admitting: Obstetrics and Gynecology

## 2019-07-04 DIAGNOSIS — K080 Exfoliation of teeth due to systemic causes: Secondary | ICD-10-CM | POA: Diagnosis not present

## 2019-07-26 ENCOUNTER — Ambulatory Visit: Payer: Federal, State, Local not specified - PPO | Admitting: Obstetrics and Gynecology

## 2019-08-11 DIAGNOSIS — L820 Inflamed seborrheic keratosis: Secondary | ICD-10-CM | POA: Diagnosis not present

## 2019-08-11 DIAGNOSIS — R52 Pain, unspecified: Secondary | ICD-10-CM | POA: Diagnosis not present

## 2019-08-11 DIAGNOSIS — D1801 Hemangioma of skin and subcutaneous tissue: Secondary | ICD-10-CM | POA: Diagnosis not present

## 2019-08-11 DIAGNOSIS — D22112 Melanocytic nevi of right lower eyelid, including canthus: Secondary | ICD-10-CM | POA: Diagnosis not present

## 2019-08-11 DIAGNOSIS — D2239 Melanocytic nevi of other parts of face: Secondary | ICD-10-CM | POA: Diagnosis not present

## 2019-08-24 ENCOUNTER — Ambulatory Visit: Payer: Federal, State, Local not specified - PPO | Admitting: Obstetrics and Gynecology

## 2019-09-20 ENCOUNTER — Other Ambulatory Visit: Payer: Self-pay

## 2019-09-21 ENCOUNTER — Encounter: Payer: Self-pay | Admitting: Obstetrics and Gynecology

## 2019-09-21 ENCOUNTER — Ambulatory Visit (INDEPENDENT_AMBULATORY_CARE_PROVIDER_SITE_OTHER): Payer: Federal, State, Local not specified - PPO | Admitting: Obstetrics and Gynecology

## 2019-09-21 VITALS — BP 108/60 | HR 64 | Temp 98.4°F | Ht 64.0 in | Wt 133.0 lb

## 2019-09-21 DIAGNOSIS — Z789 Other specified health status: Secondary | ICD-10-CM | POA: Diagnosis not present

## 2019-09-21 DIAGNOSIS — Z1211 Encounter for screening for malignant neoplasm of colon: Secondary | ICD-10-CM | POA: Diagnosis not present

## 2019-09-21 DIAGNOSIS — Z0000 Encounter for general adult medical examination without abnormal findings: Secondary | ICD-10-CM

## 2019-09-21 DIAGNOSIS — Z01419 Encounter for gynecological examination (general) (routine) without abnormal findings: Secondary | ICD-10-CM | POA: Diagnosis not present

## 2019-09-21 NOTE — Patient Instructions (Signed)

## 2019-09-21 NOTE — Progress Notes (Signed)
59 y.o. G0P0000 Single White or Caucasian Not Hispanic or Latino female here for annual exam. Patient would like to do 3d mammogram this year.  No bleeding. Same very long term partner, no dyspareunia. Getting married next year in [place].     Patient's last menstrual period was 09/22/2012.          Sexually active: Yes.    The current method of family planning is post menopausal status.    Exercising: Yes.    cycling  Smoker:  no  Health Maintenance: Pap: 06/09/2018 normal HPV Neg, 2014 Normal  History of abnormal Pap:  no MMG:  09/15/18 density b Bi-rads 1 neg  BMD:   Never  Colonoscopy: never, plans to do it in October.   TDaP: unsure.  Gardasil: N/A   reports that she has never smoked. She has never used smokeless tobacco. She reports current alcohol use. She reports that she does not use drugs. Rare ETOH. She works for the [employer] in Clinical biochemist.   Past Medical History:  Diagnosis Date  . Medical history non-contributory     Past Surgical History:  Procedure Laterality Date  . COSMETIC SURGERY    . CYST EXCISION     lt upper arm  . LASIK    . REFRACTIVE SURGERY    . TRIGGER FINGER RELEASE Right 10/24/2012   Procedure: RELEASE RIGHT TRIGGER THUMB;  Surgeon: Jolyn Nap, MD;  Location: [facility];  Service: Orthopedics;  Laterality: Right;  . WISDOM TOOTH EXTRACTION      Current Outpatient Medications  Medication Sig Dispense Refill  . Cholecalciferol (VITAMIN D3) 75 MCG (3000 UT) TABS Take by mouth.    . Cyanocobalamin (VITAMIN B 12 PO) Take by mouth. Taking once a week    . Multiple Vitamin (MULTIVITAMIN) tablet Take 1 tablet by mouth daily.     No current facility-administered medications for this visit.    Family History  Problem Relation Age of Onset  . Thyroid disease Mother   . Breast cancer Maternal Grandmother     Review of Systems  All other systems reviewed and are negative.   Exam:   BP 108/60   Pulse 64    Temp 98.4 F (36.9 C)   Ht 5\' 4"  (1.626 m)   Wt 133 lb (60.3 kg)   LMP 09/22/2012   SpO2 98%   BMI 22.83 kg/m   Weight change: @WEIGHTCHANGE @ Height:   Height: 5\' 4"  (162.6 cm)  Ht Readings from Last 3 Encounters:  09/21/19 5\' 4"  (1.626 m)  06/08/18 5\' 5"  (1.651 m)  10/24/12 5\' 5"  (1.651 m)    General appearance: alert, cooperative and appears stated age Head: Normocephalic, without obvious abnormality, atraumatic Neck: no adenopathy, supple, symmetrical, trachea midline and thyroid normal to inspection and palpation Lungs: clear to auscultation bilaterally Cardiovascular: regular rate and rhythm Breasts: normal appearance, no masses or tenderness Abdomen: soft, non-tender; non distended,  no masses,  no organomegaly Extremities: extremities normal, atraumatic, no cyanosis or edema Skin: Skin color, texture, turgor normal. No rashes or lesions Lymph nodes: Cervical, supraclavicular, and axillary nodes normal. No abnormal inguinal nodes palpated Neurologic: Grossly normal   Pelvic: External genitalia:  no lesions              Urethra:  normal appearing urethra with no masses, tenderness or lesions              Bartholins and Skenes: normal  Vagina: atrophic appearing vagina with normal color and discharge, no lesions              Cervix: no lesions               Bimanual Exam:  Uterus:  normal size, contour, position, consistency, mobility, non-tender              Adnexa: no mass, fullness, tenderness               Rectovaginal: Confirms               Anus:  normal sphincter tone, no lesions  Carolynn Serve chaperoned for the exam.  A:  Well Woman with normal exam  Vegan  P:   Pap UTD  Mammogram due  Colonoscopy referral placed  Discussed breast self exam  Discussed calcium and vit D intake  Screening labs

## 2019-09-22 LAB — COMPREHENSIVE METABOLIC PANEL
ALT: 16 IU/L (ref 0–32)
AST: 15 IU/L (ref 0–40)
Albumin/Globulin Ratio: 1.7 (ref 1.2–2.2)
Albumin: 4.5 g/dL (ref 3.8–4.9)
Alkaline Phosphatase: 81 IU/L (ref 39–117)
BUN/Creatinine Ratio: 15 (ref 9–23)
BUN: 10 mg/dL (ref 6–24)
Bilirubin Total: 0.6 mg/dL (ref 0.0–1.2)
CO2: 26 mmol/L (ref 20–29)
Calcium: 9.4 mg/dL (ref 8.7–10.2)
Chloride: 102 mmol/L (ref 96–106)
Creatinine, Ser: 0.66 mg/dL (ref 0.57–1.00)
GFR calc Af Amer: 112 mL/min/{1.73_m2} (ref >59)
GFR calc non Af Amer: 97 mL/min/{1.73_m2} (ref >59)
Globulin, Total: 2.6 g/dL (ref 1.5–4.5)
Glucose: 88 mg/dL (ref 65–99)
Potassium: 3.6 mmol/L (ref 3.5–5.2)
Sodium: 142 mmol/L (ref 134–144)
Total Protein: 7.1 g/dL (ref 6.0–8.5)

## 2019-09-22 LAB — LIPID PANEL
Chol/HDL Ratio: 3.2 ratio (ref 0.0–4.4)
Cholesterol, Total: 163 mg/dL (ref 100–199)
HDL: 51 mg/dL (ref >39)
LDL Chol Calc (NIH): 97 mg/dL (ref 0–99)
Triglycerides: 79 mg/dL (ref 0–149)
VLDL Cholesterol Cal: 15 mg/dL (ref 5–40)

## 2019-09-22 LAB — CBC
Hematocrit: 39.2 % (ref 34.0–46.6)
Hemoglobin: 12.7 g/dL (ref 11.1–15.9)
MCH: 30.3 pg (ref 26.6–33.0)
MCHC: 32.4 g/dL (ref 31.5–35.7)
MCV: 94 fL (ref 79–97)
Platelets: 238 10*3/uL (ref 150–450)
RBC: 4.19 x10E6/uL (ref 3.77–5.28)
RDW: 12.6 % (ref 11.7–15.4)
WBC: 7.1 10*3/uL (ref 3.4–10.8)

## 2019-09-22 LAB — VITAMIN B12: Vitamin B-12: 1759 pg/mL — ABNORMAL HIGH (ref 232–1245)

## 2019-09-22 LAB — VITAMIN D 25 HYDROXY (VIT D DEFICIENCY, FRACTURES): Vit D, 25-Hydroxy: 38 ng/mL (ref 30.0–100.0)

## 2019-11-13 ENCOUNTER — Other Ambulatory Visit: Payer: Self-pay | Admitting: Obstetrics and Gynecology

## 2019-11-13 DIAGNOSIS — Z1231 Encounter for screening mammogram for malignant neoplasm of breast: Secondary | ICD-10-CM

## 2019-11-29 ENCOUNTER — Ambulatory Visit
Admission: RE | Admit: 2019-11-29 | Discharge: 2019-11-29 | Disposition: A | Discharge status: Home / Self Care | Payer: Federal, State, Local not specified - PPO | Source: Ambulatory Visit | Attending: Obstetrics and Gynecology | Admitting: Obstetrics and Gynecology

## 2019-11-29 DIAGNOSIS — Z1231 Encounter for screening mammogram for malignant neoplasm of breast: Secondary | ICD-10-CM

## 2019-12-21 ENCOUNTER — Encounter: Payer: Self-pay | Admitting: Internal Medicine

## 2020-02-12 ENCOUNTER — Ambulatory Visit (AMBULATORY_SURGERY_CENTER): Payer: Federal, State, Local not specified - PPO | Admitting: *Deleted

## 2020-02-12 ENCOUNTER — Other Ambulatory Visit: Payer: Self-pay

## 2020-02-12 VITALS — Ht 64.0 in | Wt 138.0 lb

## 2020-02-12 DIAGNOSIS — Z1211 Encounter for screening for malignant neoplasm of colon: Secondary | ICD-10-CM

## 2020-02-12 MED ORDER — NA SULFATE-K SULFATE-MG SULF 17.5-3.13-1.6 GM/177ML PO SOLN: ORAL | 0 refills | Status: DC

## 2020-02-12 NOTE — Progress Notes (Signed)
Patient is here in-person for PV. Patient denies any allergies to eggs or soy. Patient denies any problems with anesthesia/sedation. Patient denies any oxygen use at home. Patient denies taking any diet/weight loss medications or blood thinners. Patient is not being treated for MRSA or C-diff. Patient is aware of our care-partner policy and Covid-19 safety protocol. EMMI education assisgned to the patient for the procedure, sent to MyChart.   COVID-19 vaccines completed on 08/2019  [employer], per patient.

## 2020-02-22 ENCOUNTER — Telehealth: Payer: Self-pay | Admitting: Internal Medicine

## 2020-02-22 DIAGNOSIS — Z1211 Encounter for screening for malignant neoplasm of colon: Secondary | ICD-10-CM

## 2020-02-22 MED ORDER — SUPREP BOWEL PREP KIT 17.5-3.13-1.6 GM/177ML PO SOLN: 1.0000 | Freq: Once | ORAL | 0 refills | Status: AC

## 2020-02-22 NOTE — Telephone Encounter (Signed)
Pt states that her pharmacy never received prescription for Suprep. Pls send it again.

## 2020-02-22 NOTE — Telephone Encounter (Signed)
Resent Suprep with codes to [facility]

## 2020-02-26 ENCOUNTER — Encounter: Payer: Federal, State, Local not specified - PPO | Admitting: Internal Medicine

## 2020-02-26 ENCOUNTER — Encounter: Payer: Self-pay | Admitting: Internal Medicine

## 2020-02-26 ENCOUNTER — Other Ambulatory Visit: Payer: Self-pay

## 2020-02-26 ENCOUNTER — Ambulatory Visit (AMBULATORY_SURGERY_CENTER): Payer: Federal, State, Local not specified - PPO | Admitting: Internal Medicine

## 2020-02-26 VITALS — BP 106/59 | HR 55 | Temp 97.3°F | Resp 10 | Ht 64.0 in | Wt 138.0 lb

## 2020-02-26 DIAGNOSIS — Z1211 Encounter for screening for malignant neoplasm of colon: Secondary | ICD-10-CM | POA: Diagnosis not present

## 2020-02-26 MED ORDER — SODIUM CHLORIDE 0.9 % IV SOLN: 500.0000 mL | Freq: Once | INTRAVENOUS | Status: AC

## 2020-02-26 NOTE — Progress Notes (Signed)
Report to PACU, RN, vss, BBS= Clear.  

## 2020-02-26 NOTE — Op Note (Signed)
[facility] Patient Name: Tina Garrett Procedure Date: 02/26/2020 3:03 PM MRN: [id number] Endoscopist: Wilhemina Bonito. Marina Goodell , MD Age: 59 Referring MD:  Date of Birth: 1960/05/25 Gender: Female Account #: [id number] Procedure:                Colonoscopy Indications:              Screening for colorectal malignant neoplasm Medicines:                Monitored Anesthesia Care Procedure:                Pre-Anesthesia Assessment:                           - Prior to the procedure, a History and Physical                            was performed, and patient medications and                            allergies were reviewed. The patient's tolerance of                            previous anesthesia was also reviewed. The risks                            and benefits of the procedure and the sedation                            options and risks were discussed with the patient.                            All questions were answered, and informed consent                            was obtained. Prior Anticoagulants: The patient has                            taken no previous anticoagulant or antiplatelet                            agents. ASA Grade Assessment: I - A normal, healthy                            patient. After reviewing the risks and benefits,                            the patient was deemed in satisfactory condition to                            undergo the procedure.                           After obtaining informed consent, the colonoscope  was passed under direct vision. Throughout the                            procedure, the patient's blood pressure, pulse, and                            oxygen saturations were monitored continuously. The                            Colonoscope was introduced through the anus and                            advanced to the the cecum, identified by                            appendiceal orifice and ileocecal  valve. The                            ileocecal valve, appendiceal orifice, and rectum                            were photographed. The quality of the bowel                            preparation was excellent. The colonoscopy was                            performed without difficulty. The patient tolerated                            the procedure well. The bowel preparation used was                            SUPREP via split dose instruction. Scope In: 3:14:38 PM Scope Out: 3:27:40 PM Scope Withdrawal Time: 0 hours 9 minutes 25 seconds  Total Procedure Duration: 0 hours 13 minutes 2 seconds  Findings:                 A few small-mouthed diverticula were found in the                            sigmoid colon.                           Internal hemorrhoids were found during                            retroflexion. The hemorrhoids were moderate.                           The exam was otherwise without abnormality on                            direct and retroflexion views. Complications:            No immediate complications.  Estimated blood loss:                            None. Estimated Blood Loss:     Estimated blood loss: none. Impression:               - Diverticulosis in the sigmoid colon.                           - Internal hemorrhoids.                           - The examination was otherwise normal on direct                            and retroflexion views.                           - No specimens collected. Recommendation:           - Repeat colonoscopy in 10 years for screening                            purposes.                           - Patient has a contact number available for                            emergencies. The signs and symptoms of potential                            delayed complications were discussed with the                            patient. Return to normal activities tomorrow.                            Written discharge instructions were  provided to the                            patient.                           - Resume previous diet.                           - Continue present medications. Wilhemina Bonito. Marina Goodell, MD 02/26/2020 3:32:14 PM This report has been signed electronically.

## 2020-02-26 NOTE — Patient Instructions (Signed)
Handout provided on diverticulosis and hemorrhoids.   YOU HAD AN ENDOSCOPIC PROCEDURE TODAY AT THE [facility]:   Refer to the procedure report that was given to you for any specific questions about what was found during the examination.  If the procedure report does not answer your questions, please call your gastroenterologist to clarify.  If you requested that your care partner not be given the details of your procedure findings, then the procedure report has been included in a sealed envelope for you to review at your convenience later.  YOU SHOULD EXPECT: Some feelings of bloating in the abdomen. Passage of more gas than usual.  Walking can help get rid of the air that was put into your GI tract during the procedure and reduce the bloating. If you had a lower endoscopy (such as a colonoscopy or flexible sigmoidoscopy) you may notice spotting of blood in your stool or on the toilet paper. If you underwent a bowel prep for your procedure, you may not have a normal bowel movement for a few days.  Please Note:  You might notice some irritation and congestion in your nose or some drainage.  This is from the oxygen used during your procedure.  There is no need for concern and it should clear up in a day or so.  SYMPTOMS TO REPORT IMMEDIATELY:   Following lower endoscopy (colonoscopy or flexible sigmoidoscopy):  Excessive amounts of blood in the stool  Significant tenderness or worsening of abdominal pains  Swelling of the abdomen that is new, acute  Fever of 100F or higher  For urgent or emergent issues, a gastroenterologist can be reached at any hour by calling [phone]. Do not use MyChart messaging for urgent concerns.    DIET:  We do recommend a small meal at first, but then you may proceed to your regular diet.  Drink plenty of fluids but you should avoid alcoholic beverages for 24 hours.  ACTIVITY:  You should plan to take it easy for the rest of today and you should  NOT DRIVE or use heavy machinery until tomorrow (because of the sedation medicines used during the test).    FOLLOW UP: Our staff will call the number listed on your records 48-72 hours following your procedure to check on you and address any questions or concerns that you may have regarding the information given to you following your procedure. If we do not reach you, we will leave a message.  We will attempt to reach you two times.  During this call, we will ask if you have developed any symptoms of COVID 19. If you develop any symptoms (ie: fever, flu-like symptoms, shortness of breath, cough etc.) before then, please call [phone].  If you test positive for Covid 19 in the 2 weeks post procedure, please call and report this information to [place].    If any biopsies were taken you will be contacted by phone or by letter within the next 1-3 weeks.  Please call us at [phone] if you have not heard about the biopsies in 3 weeks.    SIGNATURES/CONFIDENTIALITY: You and/or your care partner have signed paperwork which will be entered into your electronic medical record.  These signatures attest to the fact that that the information above on your After Visit Summary has been reviewed and is understood.  Full responsibility of the confidentiality of this discharge information lies with you and/or your care-partner.

## 2020-02-26 NOTE — Progress Notes (Signed)
Pt's states no medical or surgical changes since previsit or office visit. 

## 2020-02-28 ENCOUNTER — Telehealth: Payer: Self-pay

## 2020-02-28 NOTE — Telephone Encounter (Signed)
°  Follow up Call-  Call back number 02/26/2020  Post procedure Call Back phone  # [phone]  Permission to leave phone message Yes  Some recent data might be hidden     Patient questions:  Do you have a fever, pain , or abdominal swelling? No. Pain Score  0 *  Have you tolerated food without any problems? Yes.    Have you been able to return to your normal activities? Yes.    Do you have any questions about your discharge instructions: Diet   No. Medications  No. Follow up visit  No.  Do you have questions or concerns about your Care? No.  Actions: * If pain score is 4 or above: No action needed, pain <4. 1. Have you developed a fever since your procedure? no  2.   Have you had an respiratory symptoms (SOB or cough) since your procedure? no  3.   Have you tested positive for COVID 19 since your procedure no  4.   Have you had any family members/close contacts diagnosed with the COVID 19 since your procedure?  no   If yes to any of these questions please route to Laverna Peace, RN and Karlton Lemon, RN

## 2020-11-13 ENCOUNTER — Other Ambulatory Visit: Payer: Self-pay

## 2020-11-13 ENCOUNTER — Ambulatory Visit: Payer: Federal, State, Local not specified - PPO | Admitting: Podiatry

## 2020-11-13 DIAGNOSIS — L989 Disorder of the skin and subcutaneous tissue, unspecified: Secondary | ICD-10-CM | POA: Diagnosis not present

## 2020-11-13 NOTE — Progress Notes (Signed)
   Subjective: 60 y.o. female presenting to the office today for pain and sensitivity associated to a skin lesion to the fifth MTPJ of the right foot.  Patient states that she was walking her dog yesterday and she noticed the pain.  It is symptomatic with shoes.  She presents for further treatment and evaluation   Past Medical History:  Diagnosis Date   Medical history non-contributory      Objective:  Physical Exam General: Alert and oriented x3 in no acute distress  Dermatology: Hyperkeratotic lesion(s) present on the fifth MTPJ right foot. Pain on palpation with a central nucleated core noted. Skin is warm, dry and supple bilateral lower extremities. Negative for open lesions or macerations.  Vascular: Palpable pedal pulses bilaterally. No edema or erythema noted. Capillary refill within normal limits.  Neurological: Epicritic and protective threshold grossly intact bilaterally.   Musculoskeletal Exam: Pain on palpation at the keratotic lesion(s) noted. Range of motion within normal limits bilateral. Muscle strength 5/5 in all groups bilateral.  Assessment: 1.  Porokeratosis/benign skin lesion RT foot   Plan of Care:  1. Patient evaluated 2. Excisional debridement of keratoic lesion(s) using a chisel blade was performed without incident.  Salicylic acid applied 3. Dressed area with light dressing. 4.  Recommend corn and callus remover daily 5.  Return to clinic as needed  *Getting married in a [employer] in [place] October 2022.  Felecia Shelling, DPM [facility]  Dr. Felecia Shelling, DPM    [address] [place], [place] [address]                Office [phone]  Fax [phone]

## 2021-01-09 IMAGING — MG DIGITAL SCREENING BILATERAL MAMMOGRAM WITH TOMO AND CAD
8 series · 8 of 24 positions shown · non-contrast
Comparison: None.
COMPARISON: None.

Addendum:
CLINICAL DATA: Screening.

EXAM:
DIGITAL SCREENING BILATERAL MAMMOGRAM WITH TOMO AND CAD

[R CC synth-2D]
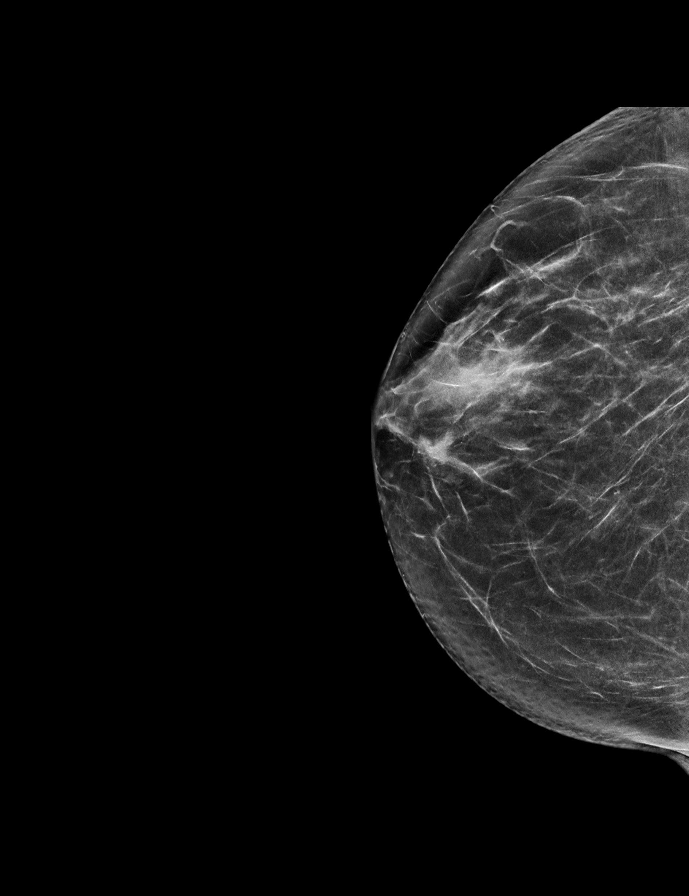

[R MLO synth-2D]
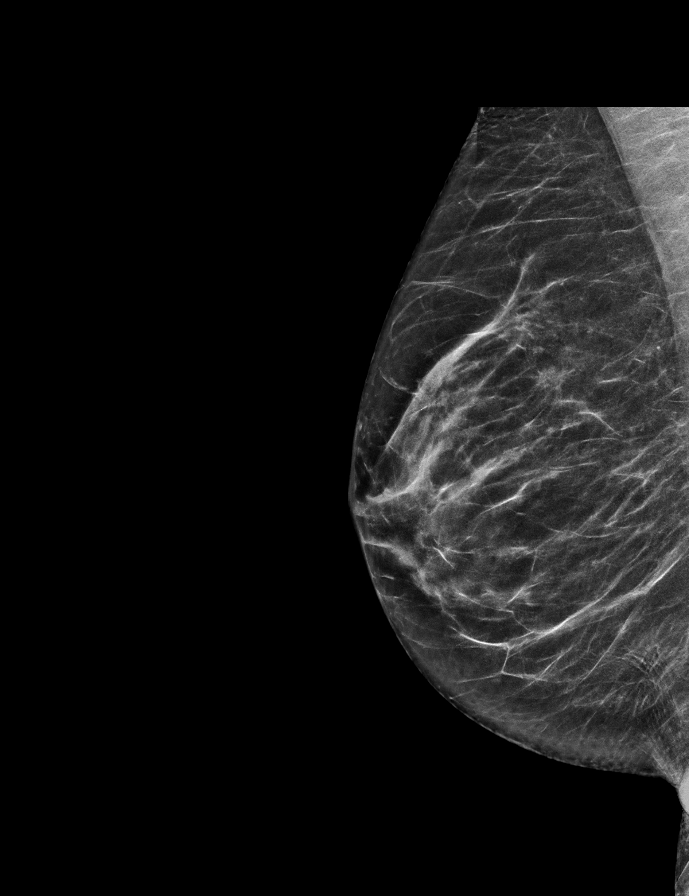

[L CC synth-2D]
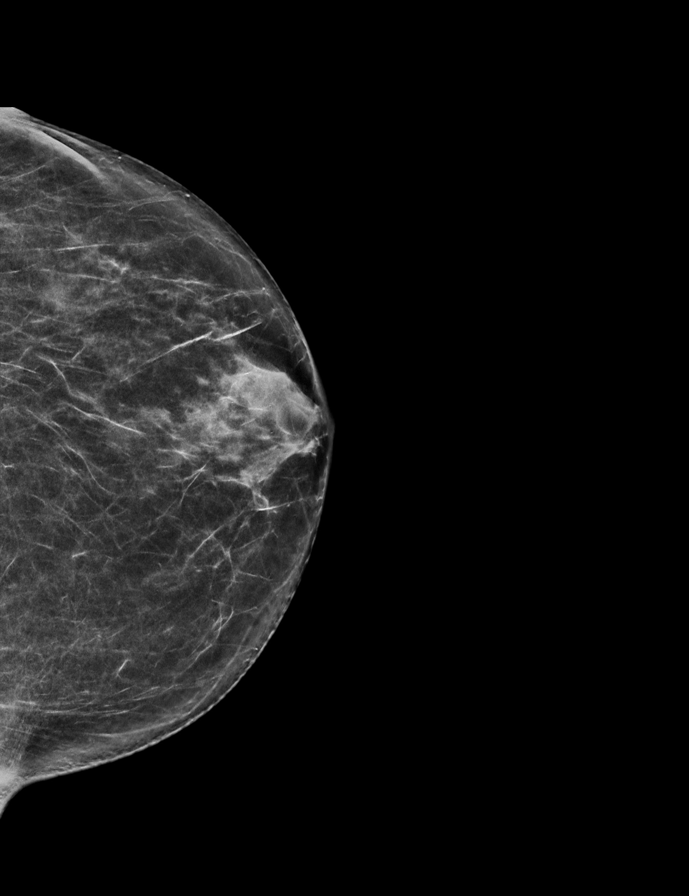

[L MLO synth-2D]
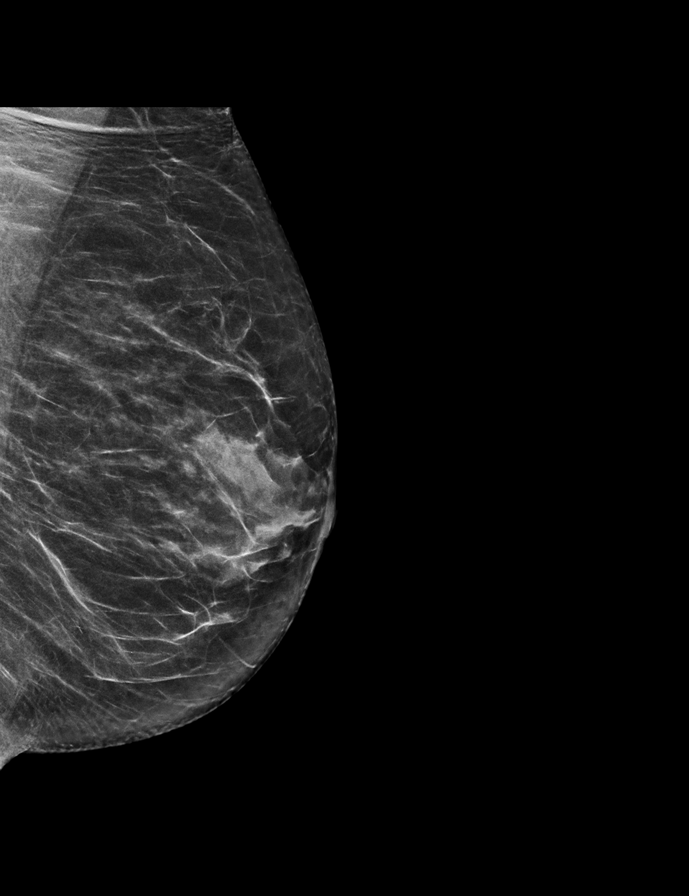

[R MLO tomo · tomo slice 31/61.0]
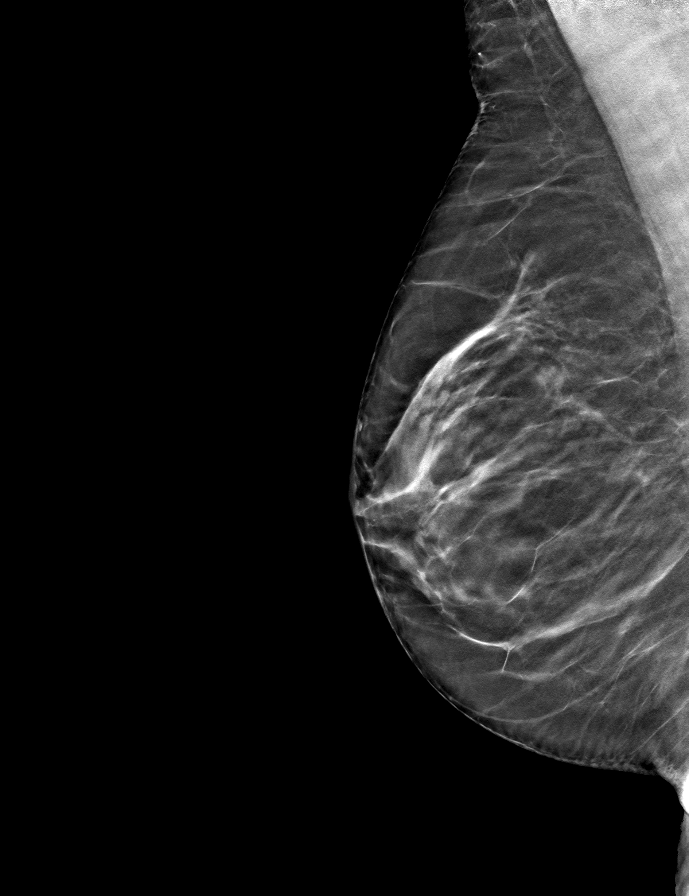

[R CC tomo · tomo slice 33/64.0]
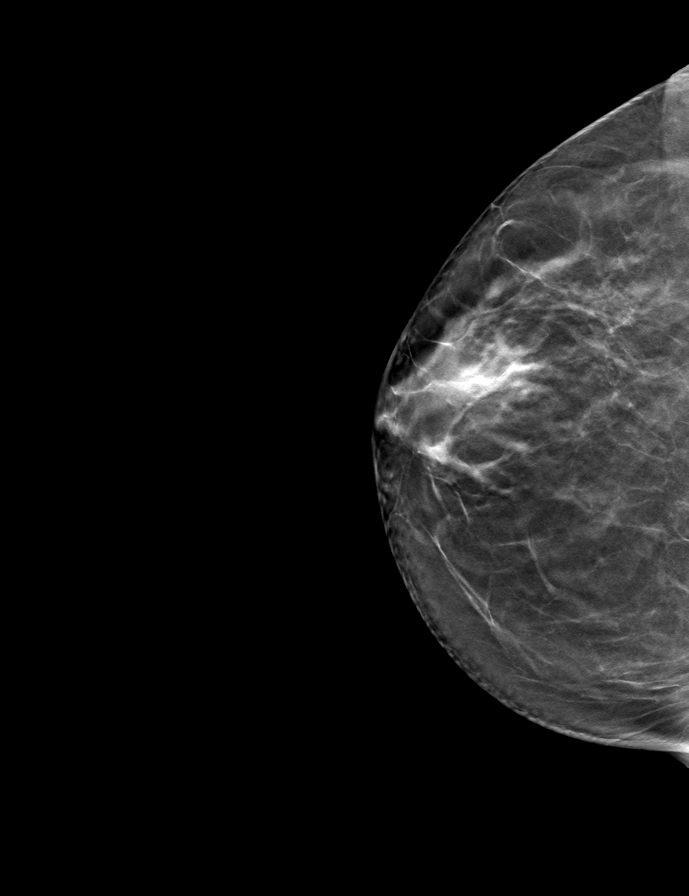

[L MLO tomo · tomo slice 31/62.0]
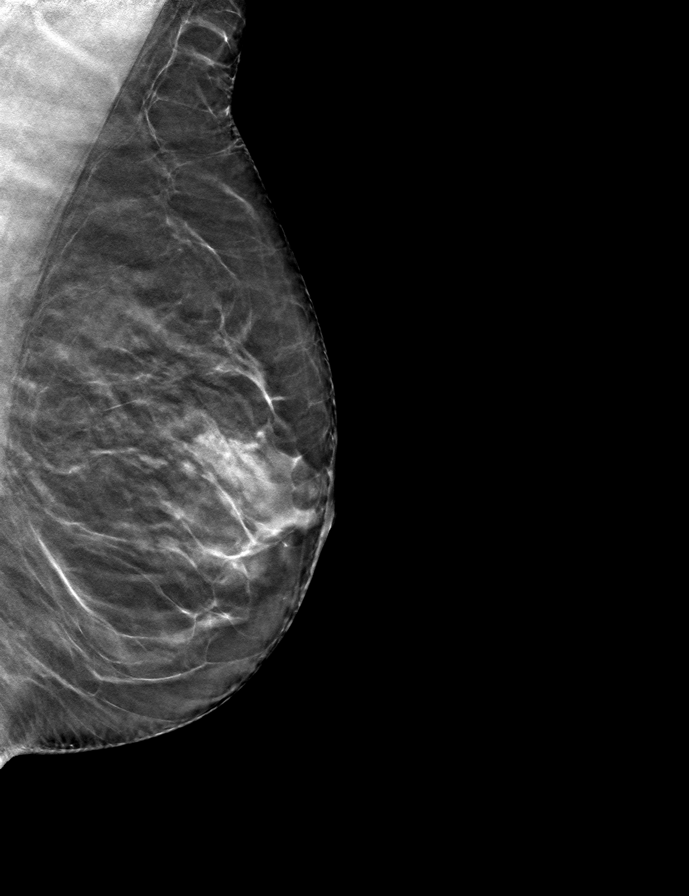

[L CC tomo · tomo slice 32/63.0]
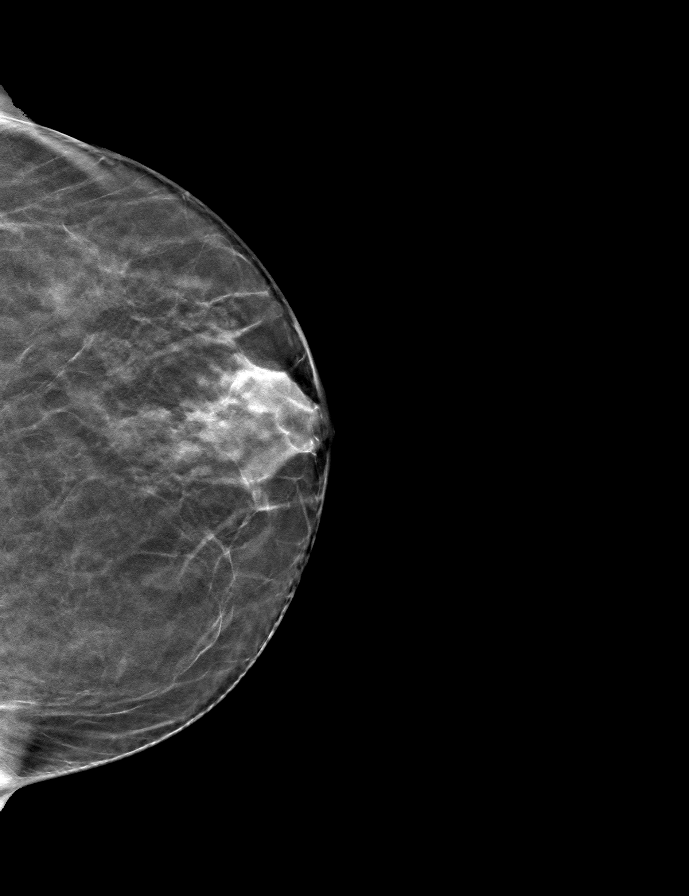

[8 of 24 positions shown; findings below may reference images not displayed]

Multiple requests for outside prior studies have
been unsuccessful.

ACR Breast Density Category b: There are scattered areas of
fibroglandular density.
FINDINGS: In the right breast, possible asymmetries warrants further
evaluation. In the left breast, no findings suspicious for
malignancy. Images were processed with CAD.
IMPRESSION: Further evaluation is suggested for possible asymmetries in the
right breast.

RECOMMENDATION:
Diagnostic mammogram and possibly ultrasound of the right breast.
(Code:LW-9-CC5)

The patient will be contacted regarding the findings, and additional
imaging will be scheduled.

BI-RADS CATEGORY  0: Incomplete. Need additional imaging evaluation
and/or prior mammograms for comparison.

ADDENDUM:
Outside studies dated 05/24/2009 have been obtained and compared.

No change from original report.

Possible asymmetries within the RIGHT breast warrant further
evaluation.

*** End of Addendum ***
Multiple requests for outside prior studies have
been unsuccessful.

ACR Breast Density Category b: There are scattered areas of
fibroglandular density.
FINDINGS: In the right breast, possible asymmetries warrants further
evaluation. In the left breast, no findings suspicious for
malignancy. Images were processed with CAD.
IMPRESSION: Further evaluation is suggested for possible asymmetries in the
right breast.

RECOMMENDATION:
Diagnostic mammogram and possibly ultrasound of the right breast.
(Code:LW-9-CC5)

The patient will be contacted regarding the findings, and additional
imaging will be scheduled.

BI-RADS CATEGORY  0: Incomplete. Need additional imaging evaluation
and/or prior mammograms for comparison.

## 2021-10-08 DIAGNOSIS — H02831 Dermatochalasis of right upper eyelid: Secondary | ICD-10-CM | POA: Diagnosis not present

## 2021-10-08 DIAGNOSIS — H02034 Senile entropion of left upper eyelid: Secondary | ICD-10-CM | POA: Diagnosis not present

## 2021-10-08 DIAGNOSIS — H02834 Dermatochalasis of left upper eyelid: Secondary | ICD-10-CM | POA: Diagnosis not present

## 2021-10-08 DIAGNOSIS — H02031 Senile entropion of right upper eyelid: Secondary | ICD-10-CM | POA: Diagnosis not present

## 2021-12-16 NOTE — Progress Notes (Signed)
61 y.o. G0P0000 Single White or Caucasian Not Hispanic or Latino female here for annual exam.  No vaginal bleeding. Sexually active, uses lubrication, no pain.  Same long term partner (unofficially married).   She has been having hot flashes, tolerable.   She has hemorrhoids that have bled a couple of times. Most recently Wednesday. Slightly uncomfortable. Started after she had some diarrhea (diet related).   She is vegan.     Patient's last menstrual period was 09/22/2012.          Sexually active: Yes.    The current method of family planning is post menopausal status.    Exercising: Yes.     Weight training  Smoker:  no  Health Maintenance: Pap:  06/09/2018 normal HPV Neg, 2014 Normal  History of abnormal Pap:  no MMG:  12/01/19 Bi-rads 1 neg  BMD:   none  Colonoscopy: 02/26/20 normal f/u 10 years  TDaP:  unsure  Gardasil: n/a   reports that she has never smoked. She has never used smokeless tobacco. She reports current alcohol use. She reports that she does not use drugs.  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   Past Medical History:  Diagnosis Date   Medical history non-contributory     Past Surgical History:  Procedure Laterality Date   COSMETIC SURGERY     CYST EXCISION     lt upper arm   LASIK     REFRACTIVE SURGERY     TRIGGER FINGER RELEASE Right 10/24/2012   Procedure: RELEASE RIGHT TRIGGER THUMB;  Surgeon: Jodi Marble, MD;  [facility];  Service: Orthopedics;  Laterality: Right;   WISDOM TOOTH EXTRACTION      Current Outpatient Medications  Medication Sig Dispense Refill   Cholecalciferol (VITAMIN D3) 75 MCG (3000 UT) TABS Take by mouth.     Cyanocobalamin (VITAMIN B 12 PO) Take by mouth. Taking once a week     hydrocortisone (ANUSOL-HC) 2.5 % rectal cream Place rectally 2 (two) times daily. 30 g 1   Multiple Vitamin (MULTIVITAMIN) tablet Take 1 tablet by mouth daily.     Omega-3 Fatty Acids (OMEGA 3 PO)  Take by mouth.     Current Facility-Administered Medications  Medication Dose Route Frequency Provider Last Rate Last Admin   0.9 %  sodium chloride infusion  500 mL Intravenous Once Hilarie Fredrickson, MD        Family History  Problem Relation Age of Onset   Thyroid disease Mother    Breast cancer Maternal Grandmother    Colon cancer Neg Hx    Colon polyps Neg Hx    Esophageal cancer Neg Hx    Rectal cancer Neg Hx    Stomach cancer Neg Hx     Review of Systems  All other systems reviewed and are negative.   Exam:   BP 116/68   Pulse 78   Ht 5\' 5"  (1.651 m)   Wt 142 lb (64.4 kg)   LMP 09/22/2012   SpO2 100%   BMI 23.63 kg/m   Weight change: @WEIGHTCHANGE @ Height:   Height: 5\' 5"  (165.1 cm)  Ht Readings from Last 3 Encounters:  12/23/21 5\' 5"  (1.651 m)  02/26/20 5\' 4"  (1.626 m)  02/12/20 5\' 4"  (1.626 m)    General appearance: alert, cooperative and appears stated age Head: Normocephalic, without obvious abnormality, atraumatic Neck: no adenopathy, supple, symmetrical, trachea midline and thyroid normal to inspection and palpation Lungs: clear  to auscultation bilaterally Cardiovascular: regular rate and rhythm Breasts: normal appearance, no masses or tenderness Abdomen: soft, non-tender; non distended,  no masses,  no organomegaly Extremities: extremities normal, atraumatic, no cyanosis or edema Skin: Skin color, texture, turgor normal. No rashes or lesions Lymph nodes: Cervical, supraclavicular, and axillary nodes normal. No abnormal inguinal nodes palpated Neurologic: Grossly normal   Pelvic: External genitalia:  no lesions              Urethra:  normal appearing urethra with no masses, tenderness or lesions              Bartholins and Skenes: normal                 Vagina: normal appearing vagina with normal color and discharge, no lesions              Cervix: no lesions               Bimanual Exam:  Uterus:  normal size, contour, position, consistency, mobility,  non-tender              Adnexa: no mass, fullness, tenderness               Rectovaginal: Confirms               Anus:  normal sphincter tone, no lesions  Carolynn Serve, CMA chaperoned for the exam.  1. Well woman exam Discussed breast self exam Discussed calcium and vit D intake Mammogram overdue, # given Colonoscopy is UTD No pap this year  2. Elevated vitamin B12 level She decreased her vit B12 supplements - Vitamin B12  3. Vitamin D deficiency On a supplement - VITAMIN D 25 Hydroxy (Vit-D Deficiency, Fractures)  4. Vegan diet - VITAMIN D 25 Hydroxy (Vit-D Deficiency, Fractures) - CBC - Vitamin B12  5. History of hemorrhoids - hydrocortisone (ANUSOL-HC) 2.5 % rectal cream; Place rectally 2 (two) times daily.  Dispense: 30 g; Refill: 1  6. Screening, lipid - Lipid panel  7. Laboratory exam ordered as part of routine general medical examination - Comprehensive metabolic panel

## 2021-12-23 ENCOUNTER — Encounter: Payer: Self-pay | Admitting: Obstetrics and Gynecology

## 2021-12-23 ENCOUNTER — Ambulatory Visit (INDEPENDENT_AMBULATORY_CARE_PROVIDER_SITE_OTHER): Payer: Federal, State, Local not specified - PPO | Admitting: Obstetrics and Gynecology

## 2021-12-23 VITALS — BP 116/68 | HR 78 | Ht 65.0 in | Wt 142.0 lb

## 2021-12-23 DIAGNOSIS — Z01419 Encounter for gynecological examination (general) (routine) without abnormal findings: Secondary | ICD-10-CM | POA: Diagnosis not present

## 2021-12-23 DIAGNOSIS — Z0000 Encounter for general adult medical examination without abnormal findings: Secondary | ICD-10-CM | POA: Diagnosis not present

## 2021-12-23 DIAGNOSIS — R748 Abnormal levels of other serum enzymes: Secondary | ICD-10-CM

## 2021-12-23 DIAGNOSIS — E559 Vitamin D deficiency, unspecified: Secondary | ICD-10-CM | POA: Diagnosis not present

## 2021-12-23 DIAGNOSIS — Z789 Other specified health status: Secondary | ICD-10-CM | POA: Diagnosis not present

## 2021-12-23 DIAGNOSIS — Z8719 Personal history of other diseases of the digestive system: Secondary | ICD-10-CM

## 2021-12-23 DIAGNOSIS — Z13220 Encounter for screening for lipoid disorders: Secondary | ICD-10-CM

## 2021-12-23 MED ORDER — HYDROCORTISONE (PERIANAL) 2.5 % EX CREA: TOPICAL_CREAM | Freq: Two times a day (BID) | CUTANEOUS | 1 refills | Status: AC

## 2021-12-23 NOTE — Patient Instructions (Addendum)

## 2021-12-24 LAB — LIPID PANEL
Cholesterol: 151 mg/dL (ref <200)
HDL: 49 mg/dL — ABNORMAL LOW (ref >=50)
LDL Cholesterol (Calc): 81 mg/dL (calc)
Non-HDL Cholesterol (Calc): 102 mg/dL (calc) (ref <130)
Total CHOL/HDL Ratio: 3.1 (calc) (ref <5.0)
Triglycerides: 111 mg/dL (ref <150)

## 2021-12-24 LAB — COMPREHENSIVE METABOLIC PANEL
AG Ratio: 1.8 (calc) (ref 1.0–2.5)
ALT: 12 U/L (ref 6–29)
AST: 15 U/L (ref 10–35)
Albumin: 4.5 g/dL (ref 3.6–5.1)
Alkaline phosphatase (APISO): 71 U/L (ref 37–153)
BUN: 10 mg/dL (ref 7–25)
CO2: 30 mmol/L (ref 20–32)
Calcium: 9.3 mg/dL (ref 8.6–10.4)
Chloride: 105 mmol/L (ref 98–110)
Creat: 0.6 mg/dL (ref 0.50–1.05)
Globulin: 2.5 g/dL (calc) (ref 1.9–3.7)
Glucose, Bld: 91 mg/dL (ref 65–99)
Potassium: 4.1 mmol/L (ref 3.5–5.3)
Sodium: 141 mmol/L (ref 135–146)
Total Bilirubin: 0.7 mg/dL (ref 0.2–1.2)
Total Protein: 7 g/dL (ref 6.1–8.1)

## 2021-12-24 LAB — VITAMIN B12: Vitamin B-12: 654 pg/mL (ref 200–1100)

## 2021-12-24 LAB — CBC
HCT: 40.3 % (ref 35.0–45.0)
Hemoglobin: 13.2 g/dL (ref 11.7–15.5)
MCH: 30.3 pg (ref 27.0–33.0)
MCHC: 32.8 g/dL (ref 32.0–36.0)
MCV: 92.4 fL (ref 80.0–100.0)
MPV: 10.7 fL (ref 7.5–12.5)
Platelets: 249 10*3/uL (ref 140–400)
RBC: 4.36 10*6/uL (ref 3.80–5.10)
RDW: 11.8 % (ref 11.0–15.0)
WBC: 7 10*3/uL (ref 3.8–10.8)

## 2021-12-24 LAB — VITAMIN D 25 HYDROXY (VIT D DEFICIENCY, FRACTURES): Vit D, 25-Hydroxy: 30 ng/mL (ref 30–100)

## 2023-01-28 ENCOUNTER — Encounter (HOSPITAL_COMMUNITY): Payer: Self-pay

## 2023-01-28 ENCOUNTER — Ambulatory Visit (HOSPITAL_COMMUNITY)
Admission: EM | Admit: 2023-01-28 | Discharge: 2023-01-28 | Disposition: A | Discharge status: Home / Self Care | Payer: Federal, State, Local not specified - PPO | Attending: Emergency Medicine | Admitting: Emergency Medicine

## 2023-01-28 DIAGNOSIS — U071 COVID-19: Secondary | ICD-10-CM | POA: Diagnosis not present

## 2023-01-28 LAB — BASIC METABOLIC PANEL
Anion gap: 8 (ref 5–15)
BUN: 12 mg/dL (ref 8–23)
CO2: 29 mmol/L (ref 22–32)
Calcium: 9.3 mg/dL (ref 8.9–10.3)
Chloride: 103 mmol/L (ref 98–111)
Creatinine, Ser: 0.54 mg/dL (ref 0.44–1.00)
GFR, Estimated: >60 mL/min (ref >60)
Glucose, Bld: 94 mg/dL (ref 70–99)
Potassium: 4.3 mmol/L (ref 3.5–5.1)
Sodium: 140 mmol/L (ref 135–145)

## 2023-01-28 MED ORDER — PAXLOVID (300/100) 20 X 150 MG & 10 X 100MG PO TBPK: 3.0000 | ORAL_TABLET | Freq: Two times a day (BID) | ORAL | 0 refills | Status: AC

## 2023-01-28 NOTE — Discharge Instructions (Signed)
Try Mucinex (generic guaifenesin okay to use) and nasal saline spray to help manage your nasal congestion.  This may also help with your sore throat.  Other things like steamy showers or Vicks VapoRub can help your congestion drained from your nose instead of down the back your throat.  Herbal tea, like chamomile, with lemon can help soothe the sore throat as well.  Other things that can help with sore throat are things like gargling with salt water, throat lozenges, and throat sprays.

## 2023-01-28 NOTE — Medical Student Note (Signed)
[facility] [employer] account manager Note For educational purposes for Medical, PA and NP students only and not part of the legal medical record.   CSN: [id number] Arrival date & time: 01/28/23  0910      History   Chief Complaint Chief Complaint  Patient presents with   Covid Positive   Cough    HPI Tina Garrett is a 62 y.o. female who presents with complaints of being Covid-19 positive (confirmed with home test) with congestion, runny nose, sore throat and lethargy since 01/24/23. Patient reports that symptoms have been constant but mild in severity. She has previously had Covid-19 in March of 2023 which resolved after three days. She states she has not tried any OTC management for her symptoms. She denies any fever, chills, dyspnea, otalgia, nausea, vomiting, or diarrhea.    Cough   Past Medical History:  Diagnosis Date   Medical history non-contributory     There are no problems to display for this patient.   Past Surgical History:  Procedure Laterality Date   COSMETIC SURGERY     CYST EXCISION     lt upper arm   LASIK     REFRACTIVE SURGERY     TRIGGER FINGER RELEASE Right 10/24/2012   Procedure: RELEASE RIGHT TRIGGER THUMB;  Surgeon: Jodi Marble, MD;  Location: [facility];  Service: Orthopedics;  Laterality: Right;   WISDOM TOOTH EXTRACTION      OB History     Gravida  0   Para  0   Term  0   Preterm  0   AB  0   Living  0      SAB  0   IAB  0   Ectopic  0   Multiple  0   Live Births  0            Home Medications    Prior to Admission medications   Medication Sig Start Date End Date Taking? Authorizing Provider  Cholecalciferol (VITAMIN D3) 75 MCG (3000 UT) TABS Take by mouth.   Yes [provider]  Cyanocobalamin (VITAMIN B 12 PO) Take by mouth. Taking once a week   Yes [provider]  Multiple Vitamin (MULTIVITAMIN) tablet Take 1 tablet by mouth daily.   Yes [provider]  nirmatrelvir & ritonavir (PAXLOVID, 300/100,) 20 x 150 MG & 10 x 100MG  TBPK Take 3 tablets by mouth 2 (two) times daily for 5 days. 01/28/23 02/02/23 Yes Cathlyn Parsons, NP  Omega-3 Fatty Acids (OMEGA 3 PO) Take by mouth.   Yes [provider]  hydrocortisone (ANUSOL-HC) 2.5 % rectal cream Place rectally 2 (two) times daily. 12/23/21   Romualdo Bolk, MD    Family History Family History  Problem Relation Age of Onset   Thyroid disease Mother    Breast cancer Maternal Grandmother    Colon cancer Neg Hx    Colon polyps Neg Hx    Esophageal cancer Neg Hx    Rectal cancer Neg Hx    Stomach cancer Neg Hx     Social History Social History   Tobacco Use   Smoking status: Never   Smokeless tobacco: Never  Vaping Use   Vaping status: Never Used  Substance Use Topics   Alcohol use: Yes    Comment: occ/rarely   Drug use: No     Allergies   Bismuth-containing compounds   Review of Systems Review of Systems  Respiratory:  Positive  for cough.      Physical Exam Updated Vital Signs BP 113/74 (BP Location: Right Arm)   Pulse 66   Temp 98.3 F (36.8 C) (Oral)   Resp 18   Ht 5\' 5"  (1.651 m)   Wt 64.4 kg   LMP 09/22/2012   SpO2 98%   BMI 23.63 kg/m   Physical Exam Constitutional:      Appearance: Normal appearance. She is not ill-appearing.  HENT:     Head: Normocephalic and atraumatic.     Right Ear: Tympanic membrane and ear canal normal.     Left Ear: Tympanic membrane and ear canal normal.     Nose: Congestion and rhinorrhea present.     Mouth/Throat:     Mouth: Mucous membranes are moist.     Pharynx: No oropharyngeal exudate or posterior oropharyngeal erythema.  Cardiovascular:     Rate and Rhythm: Normal rate and regular rhythm.     Heart sounds: Normal heart sounds.  Pulmonary:     Effort: Pulmonary effort is normal. No respiratory distress.     Breath sounds: Normal breath sounds. No wheezing.  Chest:     Chest wall: No tenderness.   Lymphadenopathy:     Cervical: No cervical adenopathy.  Skin:    General: Skin is warm and dry.  Neurological:     Mental Status: She is alert and oriented to person, place, and time. Mental status is at baseline.      ED Treatments / Results  Labs (all labs ordered are listed, but only abnormal results are displayed) Labs Reviewed  BASIC METABOLIC PANEL    EKG  Radiology No results found.  Procedures Procedures (including critical care time)  Medications Ordered in ED Medications - No data to display   Initial Impression / Assessment and Plan / ED Course  I have reviewed the triage vital signs and the nursing notes.  Pertinent labs & imaging results that were available during my care of the patient were reviewed by me and considered in my medical decision making (see chart for details).   Discussed management of Covid-19 with Paxlovid. Patient meets age requirements for therapy. Will verify renal function with BMP and will adjust treatment if needed. Discussed non-pharmacological management of symptoms with steam for congestion and salt water gargles and chamomile tea with honey and lemon for sore throat.     Final Clinical Impressions(s) / ED Diagnoses   Final diagnoses:  COVID    New Prescriptions New Prescriptions   NIRMATRELVIR & RITONAVIR (PAXLOVID, 300/100,) 20 X 150 MG & 10 X 100MG  TBPK    Take 3 tablets by mouth 2 (two) times daily for 5 days.

## 2023-01-28 NOTE — ED Triage Notes (Signed)
Covid + Cough; Chills; Sore Throat; Fatigue, nasal congestion onset of symptoms Saturday night. Patient's husband has since tested positive with a home test as well.  Tested positive on Sunday.  Patient has not tried any otc meds for her symptoms.

## 2023-02-01 NOTE — ED Provider Notes (Signed)
[facility]    CSN: [id number] Arrival date & time: 01/28/23  4098      History   Chief Complaint Chief Complaint  Patient presents with   Covid Positive   Cough    HPI SELESTE JAKEWAY is a 62 y.o. female who presents with complaints of being Covid-19 positive (confirmed with home test) with congestion, runny nose, sore throat and lethargy since 01/24/23. Patient reports that symptoms have been constant but mild in severity. She has previously had Covid-19 in March of 2023 which resolved after three days. She states she has not tried any OTC management for her symptoms as she doesn't like to take meds. She denies any fever, chills, dyspnea, otalgia, nausea, vomiting, or diarrhea. Requests paxlovid as she is not getting better from covid this time   Cough   Past Medical History:  Diagnosis Date   Medical history non-contributory     There are no problems to display for this patient.   Past Surgical History:  Procedure Laterality Date   COSMETIC SURGERY     CYST EXCISION     lt upper arm   LASIK     REFRACTIVE SURGERY     TRIGGER FINGER RELEASE Right 10/24/2012   Procedure: RELEASE RIGHT TRIGGER THUMB;  Surgeon: Jodi Marble, MD;  Location: [facility];  Service: Orthopedics;  Laterality: Right;   WISDOM TOOTH EXTRACTION      OB History     Gravida  0   Para  0   Term  0   Preterm  0   AB  0   Living  0      SAB  0   IAB  0   Ectopic  0   Multiple  0   Live Births  0            Home Medications    Prior to Admission medications   Medication Sig Start Date End Date Taking? Authorizing Provider  Cholecalciferol (VITAMIN D3) 75 MCG (3000 UT) TABS Take by mouth.   Yes [provider]  Cyanocobalamin (VITAMIN B 12 PO) Take by mouth. Taking once a week   Yes [provider]  Multiple Vitamin (MULTIVITAMIN) tablet Take 1 tablet by mouth daily.   Yes [provider]  nirmatrelvir & ritonavir  (PAXLOVID, 300/100,) 20 x 150 MG & 10 x 100MG  TBPK Take 3 tablets by mouth 2 (two) times daily for 5 days. 01/28/23 02/02/23 Yes Cathlyn Parsons, NP  Omega-3 Fatty Acids (OMEGA 3 PO) Take by mouth.   Yes [provider]  hydrocortisone (ANUSOL-HC) 2.5 % rectal cream Place rectally 2 (two) times daily. 12/23/21   Romualdo Bolk, MD    Family History Family History  Problem Relation Age of Onset   Thyroid disease Mother    Breast cancer Maternal Grandmother    Colon cancer Neg Hx    Colon polyps Neg Hx    Esophageal cancer Neg Hx    Rectal cancer Neg Hx    Stomach cancer Neg Hx     Social History Social History   Tobacco Use   Smoking status: Never   Smokeless tobacco: Never  Vaping Use   Vaping status: Never Used  Substance Use Topics   Alcohol use: Yes    Comment: occ/rarely   Drug use: No     Allergies   Bismuth-containing compounds   Review of Systems Review of Systems  Respiratory:  Positive for cough.  Physical Exam Triage Vital Signs ED Triage Vitals  Encounter Vitals Group     BP 01/28/23 1028 113/74     Systolic BP Percentile --      Diastolic BP Percentile --      Pulse Rate 01/28/23 1028 66     Resp 01/28/23 1028 18     Temp 01/28/23 1028 98.3 F (36.8 C)     Temp Source 01/28/23 1028 Oral     SpO2 01/28/23 1028 98 %     Weight 01/28/23 1028 142 lb (64.4 kg)     Height 01/28/23 1028 5\' 5"  (1.651 m)     Head Circumference --      Peak Flow --      Pain Score 01/28/23 1027 3     Pain Loc --      Pain Education --      Exclude from Growth Chart --    No data found.  Updated Vital Signs BP 113/74 (BP Location: Right Arm)   Pulse 66   Temp 98.3 F (36.8 C) (Oral)   Resp 18   Ht 5\' 5"  (1.651 m)   Wt 142 lb (64.4 kg)   LMP 09/22/2012   SpO2 98%   BMI 23.63 kg/m   Visual Acuity Right Eye Distance:   Left Eye Distance:   Bilateral Distance:    Right Eye Near:   Left Eye Near:    Bilateral Near:     Physical  Exam Constitutional:      Appearance: Normal appearance. She is not ill-appearing or toxic-appearing.  HENT:     Right Ear: Tympanic membrane, ear canal and external ear normal.     Left Ear: Tympanic membrane, ear canal and external ear normal.     Nose: No congestion.     Mouth/Throat:     Mouth: Mucous membranes are moist.     Pharynx: Oropharynx is clear. No oropharyngeal exudate or posterior oropharyngeal erythema.  Cardiovascular:     Rate and Rhythm: Normal rate and regular rhythm.  Pulmonary:     Effort: Pulmonary effort is normal.     Breath sounds: Normal breath sounds.  Lymphadenopathy:     Cervical: No cervical adenopathy.  Neurological:     Mental Status: She is alert.      UC Treatments / Results  Labs (all labs ordered are listed, but only abnormal results are displayed) Labs Reviewed  BASIC METABOLIC PANEL    EKG   Radiology No results found.  Procedures Procedures (including critical care time)  Medications Ordered in UC Medications - No data to display  Initial Impression / Assessment and Plan / UC Course  I have reviewed the triage vital signs and the nursing notes.  Pertinent labs & imaging results that were available during my care of the patient were reviewed by me and considered in my medical decision making (see chart for details).    Will rx paxlovid. Will get BMP to check kidney function as last check was over a year ago. Discussed suppoprtive care measures, rec mask wearing to prevent spread  Final Clinical Impressions(s) / UC Diagnoses   Final diagnoses:  COVID     Discharge Instructions      Try Mucinex (generic guaifenesin okay to use) and nasal saline spray to help manage your nasal congestion.  This may also help with your sore throat.  Other things like steamy showers or Vicks VapoRub can help your congestion drained from your nose instead of down the  back your throat.  Herbal tea, like chamomile, with lemon can help soothe  the sore throat as well.  Other things that can help with sore throat are things like gargling with salt water, throat lozenges, and throat sprays.   ED Prescriptions     Medication Sig Dispense Auth. Provider   nirmatrelvir & ritonavir (PAXLOVID, 300/100,) 20 x 150 MG & 10 x 100MG  TBPK Take 3 tablets by mouth 2 (two) times daily for 5 days. 30 tablet Cathlyn Parsons, NP      PDMP not reviewed this encounter.   Cathlyn Parsons, NP 02/01/23 [phone]
